# Patient Record
Sex: Female | Born: 1995 | Race: Black or African American | Hispanic: No | Marital: Single | Smoking: Never smoker
Health system: Southern US, Community
[De-identification: ages and names within clinical notes are randomized; demographics above are authoritative.]

## PROBLEM LIST (undated history)

## (undated) DIAGNOSIS — Z789 Other specified health status: Secondary | ICD-10-CM

## (undated) HISTORY — PX: TONSILLECTOMY: SUR1361

---

## 2019-07-13 ENCOUNTER — Other Ambulatory Visit: Payer: Self-pay

## 2019-07-13 DIAGNOSIS — Z20822 Contact with and (suspected) exposure to covid-19: Secondary | ICD-10-CM

## 2019-07-14 LAB — NOVEL CORONAVIRUS, NAA: SARS-CoV-2, NAA: NOT DETECTED

## 2019-09-28 ENCOUNTER — Emergency Department (HOSPITAL_COMMUNITY): Payer: No Typology Code available for payment source

## 2019-09-28 ENCOUNTER — Encounter (HOSPITAL_COMMUNITY): Payer: Self-pay

## 2019-09-28 ENCOUNTER — Emergency Department (HOSPITAL_COMMUNITY)
Admission: EM | Admit: 2019-09-28 | Discharge: 2019-09-28 | Disposition: A | Discharge status: Home / Self Care | Payer: No Typology Code available for payment source | Attending: Emergency Medicine | Admitting: Emergency Medicine

## 2019-09-28 ENCOUNTER — Other Ambulatory Visit: Payer: Self-pay

## 2019-09-28 DIAGNOSIS — Y92410 Unspecified street and highway as the place of occurrence of the external cause: Secondary | ICD-10-CM | POA: Insufficient documentation

## 2019-09-28 DIAGNOSIS — R102 Pelvic and perineal pain: Secondary | ICD-10-CM | POA: Insufficient documentation

## 2019-09-28 DIAGNOSIS — Y9389 Activity, other specified: Secondary | ICD-10-CM | POA: Diagnosis not present

## 2019-09-28 DIAGNOSIS — M542 Cervicalgia: Secondary | ICD-10-CM | POA: Diagnosis present

## 2019-09-28 DIAGNOSIS — M25552 Pain in left hip: Secondary | ICD-10-CM | POA: Insufficient documentation

## 2019-09-28 DIAGNOSIS — M7918 Myalgia, other site: Secondary | ICD-10-CM

## 2019-09-28 DIAGNOSIS — Y999 Unspecified external cause status: Secondary | ICD-10-CM | POA: Diagnosis not present

## 2019-09-28 DIAGNOSIS — M545 Low back pain: Secondary | ICD-10-CM | POA: Diagnosis not present

## 2019-09-28 HISTORY — DX: Other specified health status: Z78.9

## 2019-09-28 LAB — POC URINE PREG, ED: Preg Test, Ur: NEGATIVE

## 2019-09-28 MED ORDER — ACETAMINOPHEN 325 MG PO TABS
650.0000 mg | ORAL_TABLET | Freq: Once | ORAL | Status: AC
  Administered 2019-09-28: 650 mg via ORAL
  Filled 2019-09-28: qty 2

## 2019-09-28 NOTE — ED Notes (Signed)
Pt to xray

## 2019-09-28 NOTE — Discharge Instructions (Addendum)
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
## 2019-09-28 NOTE — ED Notes (Signed)
Patient verbalizes understanding of discharge instructions. Opportunity for questioning and answers were provided. Armband removed by staff, pt discharged from ED.  

## 2019-09-28 NOTE — ED Notes (Addendum)
Pt transported to CT ?
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
